# Patient Record
Sex: Male | Born: 1970 | Race: Black or African American | Hispanic: No | Marital: Married | Smoking: Never smoker
Health system: Southern US, Community
[De-identification: ages and names within clinical notes are randomized; demographics above are authoritative.]

---

## 2003-11-06 ENCOUNTER — Ambulatory Visit (HOSPITAL_COMMUNITY): Admission: RE | Admit: 2003-11-06 | Discharge: 2003-11-06 | Payer: Self-pay | Admitting: Family Medicine

## 2003-12-30 ENCOUNTER — Emergency Department (HOSPITAL_COMMUNITY): Admission: EM | Admit: 2003-12-30 | Discharge: 2003-12-30 | Payer: Self-pay | Admitting: Emergency Medicine

## 2007-01-10 ENCOUNTER — Ambulatory Visit (HOSPITAL_COMMUNITY): Admission: RE | Admit: 2007-01-10 | Discharge: 2007-01-10 | Payer: Self-pay | Admitting: Family Medicine

## 2007-07-11 ENCOUNTER — Ambulatory Visit (HOSPITAL_COMMUNITY): Admission: RE | Admit: 2007-07-11 | Discharge: 2007-07-11 | Payer: Self-pay | Admitting: Internal Medicine

## 2010-03-03 ENCOUNTER — Ambulatory Visit (HOSPITAL_COMMUNITY): Admission: RE | Admit: 2010-03-03 | Discharge: 2010-03-03 | Payer: Self-pay | Admitting: Family Medicine

## 2015-07-10 ENCOUNTER — Emergency Department (HOSPITAL_COMMUNITY)
Admission: EM | Admit: 2015-07-10 | Discharge: 2015-07-10 | Disposition: A | Discharge status: Home / Self Care | Payer: BLUE CROSS/BLUE SHIELD | Attending: Emergency Medicine | Admitting: Emergency Medicine

## 2015-07-10 ENCOUNTER — Emergency Department (HOSPITAL_COMMUNITY): Payer: BLUE CROSS/BLUE SHIELD

## 2015-07-10 ENCOUNTER — Encounter (HOSPITAL_COMMUNITY): Payer: Self-pay | Admitting: *Deleted

## 2015-07-10 DIAGNOSIS — R0789 Other chest pain: Secondary | ICD-10-CM | POA: Diagnosis not present

## 2015-07-10 DIAGNOSIS — R079 Chest pain, unspecified: Secondary | ICD-10-CM | POA: Diagnosis present

## 2015-07-10 DIAGNOSIS — Z79899 Other long term (current) drug therapy: Secondary | ICD-10-CM | POA: Diagnosis not present

## 2015-07-10 LAB — BASIC METABOLIC PANEL
Anion gap: 9 (ref 5–15)
BUN: 17 mg/dL (ref 6–20)
CO2: 27 mmol/L (ref 22–32)
Calcium: 9.3 mg/dL (ref 8.9–10.3)
Chloride: 105 mmol/L (ref 101–111)
Creatinine, Ser: 1.2 mg/dL (ref 0.61–1.24)
GFR calc Af Amer: >60 mL/min (ref >60)
GFR calc non Af Amer: >60 mL/min (ref >60)
Glucose, Bld: 96 mg/dL (ref 65–99)
Potassium: 3.9 mmol/L (ref 3.5–5.1)
Sodium: 141 mmol/L (ref 135–145)

## 2015-07-10 LAB — CBC
HCT: 43.1 % (ref 39.0–52.0)
Hemoglobin: 14.9 g/dL (ref 13.0–17.0)
MCH: 31.8 pg (ref 26.0–34.0)
MCHC: 34.6 g/dL (ref 30.0–36.0)
MCV: 91.9 fL (ref 78.0–100.0)
Platelets: 193 10*3/uL (ref 150–400)
RBC: 4.69 MIL/uL (ref 4.22–5.81)
RDW: 14.2 % (ref 11.5–15.5)
WBC: 9 10*3/uL (ref 4.0–10.5)

## 2015-07-10 LAB — D-DIMER, QUANTITATIVE: D-Dimer, Quant: 0.29 ug/mL-FEU (ref 0.00–0.50)

## 2015-07-10 LAB — TROPONIN I
Troponin I: <0.03 ng/mL (ref <0.031)
Troponin I: <0.03 ng/mL (ref <0.031)

## 2015-07-10 MED ORDER — KETOROLAC TROMETHAMINE 30 MG/ML IJ SOLN
30.0000 mg | Freq: Once | INTRAMUSCULAR | Status: AC
  Administered 2015-07-10: 30 mg via INTRAVENOUS
  Filled 2015-07-10: qty 1

## 2015-07-10 MED ORDER — TRAMADOL HCL 50 MG PO TABS: 50.0000 mg | ORAL_TABLET | Freq: Four times a day (QID) | ORAL | Status: DC | PRN

## 2015-07-10 NOTE — ED Provider Notes (Signed)
CSN: [id number]     Arrival date & time 07/10/15  1923 History   First MD Initiated Contact with Patient 07/10/15 1931     Chief Complaint  Patient presents with  . Chest Pain     (Consider location/radiation/quality/duration/timing/severity/associated sxs/prior Treatment) Patient is a 44 y.o. male presenting with chest pain. The history is provided by the patient.  Chest Pain Pain location:  L chest Pain quality: sharp   Pain radiates to:  Does not radiate Pain radiates to the back: no   Pain severity:  Moderate Onset quality:  Sudden Timing:  Intermittent Progression:  Unchanged Chronicity:  New Context comment:  Worsened with deep inspiration and with certain postions, stretching his left arm over head. Relieved by:  Rest Worsened by:  Deep breathing, movement and certain positions Ineffective treatments: has tried tylenol and ibuprofen without relief. Associated symptoms: no abdominal pain, no back pain, no cough, no diaphoresis, no fever, no nausea, no numbness, no palpitations, no shortness of breath and not vomiting   Risk factors: male sex   Risk factors: no aortic disease, no coronary artery disease, no diabetes mellitus, no hypertension, no prior DVT/PE and no smoking   Risk factors comment:  Denies family history of cad before age of 71.    History reviewed. No pertinent past medical history. History reviewed. No pertinent past surgical history. No family history on file. Social History  Substance Use Topics  . Smoking status: Never Smoker   . Smokeless tobacco: None  . Alcohol Use: No    Review of Systems  Constitutional: Negative for fever and diaphoresis.  Respiratory: Negative for cough and shortness of breath.   Cardiovascular: Positive for chest pain. Negative for palpitations.  Gastrointestinal: Negative for nausea, vomiting and abdominal pain.  Musculoskeletal: Negative for back pain.  Neurological: Negative for numbness.      Allergies  Review  of patient's allergies indicates no known allergies.  Home Medications   Prior to Admission medications   Medication Sig Start Date End Date Taking? Authorizing Provider  loratadine (CLARITIN) 10 MG tablet Take 10 mg by mouth daily.   Yes Historical Provider, MD  methocarbamol (ROBAXIN) 500 MG tablet Take 500 mg by mouth 3 (three) times daily. For 10 days 06/26/15  Yes Historical Provider, MD  traMADol (ULTRAM) 50 MG tablet Take 1 tablet (50 mg total) by mouth every 6 (six) hours as needed. 07/10/15   Burgess Amor, PA-C   BP 104/86 mmHg  Pulse 51  Temp(Src) 98.3 F (36.8 C) (Oral)  Resp 18  Ht  (1.676 m)  Wt 86.183 kg  BMI 30.68 kg/m2  SpO2 95% Physical Exam  Constitutional: He appears well-developed and well-nourished.  HENT:  Head: Normocephalic and atraumatic.  Eyes: Conjunctivae are normal.  Neck: Normal range of motion.  Cardiovascular: Normal rate, regular rhythm, normal heart sounds and intact distal pulses.   Pulmonary/Chest: Effort normal and breath sounds normal. He has no wheezes. He exhibits tenderness.    Abdominal: Soft. Bowel sounds are normal. There is no tenderness.  Musculoskeletal: Normal range of motion.  Neurological: He is alert.  Skin: Skin is warm and dry.  Psychiatric: He has a normal mood and affect.  Nursing note and vitals reviewed.   ED Course  Procedures (including critical care time) Labs Review Labs Reviewed  BASIC METABOLIC PANEL  CBC  TROPONIN I  D-DIMER, QUANTITATIVE (NOT AT [facility])  TROPONIN I    Imaging Review Dg Chest 2 View  07/10/2015  CLINICAL  DATA:  Chest pain for 2 weeks, worse today. EXAM: CHEST  2 VIEW COMPARISON:  07/11/2007 FINDINGS: The heart size and mediastinal contours are within normal limits. Both lungs are clear. The visualized skeletal structures are unremarkable. IMPRESSION: No active cardiopulmonary disease. Electronically Signed   By: Burman NievesWilliam  Stevens M.D.   On: 07/10/2015 20:15   I have personally  reviewed and evaluated these images and lab results as part of my medical decision-making.   EKG Interpretation   Date/Time:  Thursday July 10 2015 19:31:51 EST Ventricular Rate:  57 PR Interval:  124 QRS Duration: 90 QT Interval:  400 QTC Calculation: 389 R Axis:   103 Text Interpretation:  Sinus rhythm Right axis deviation No previous ECGs  available Confirmed by Manus GunningANCOUR  MD, STEPHEN [phone]([id number]) on 07/10/2015 7:36:18  PM      MDM   Final diagnoses:  Left-sided chest wall pain    Patients labs reviewed.  Radiological studies were viewed, interpreted and considered during the medical decision making and disposition process. I agree with radiologists reading.  Results were also discussed with patient. Delta troponins negative, ekg without concerning pattern for cad.  Reproducible chest wall pain.  He was given tramadol, also advised to continue ibuprofen for the next week, heat tx to chest wall, f/u with pcp in 1 week if not improved. Doubt cad, d dimer negative in a perc neg pt, hx not c/w PE.       Burgess AmorJulie Rithik Odea, PA-C 07/11/15 1216  Glynn OctaveStephen Rancour, MD 07/12/15 [id number]

## 2015-07-10 NOTE — ED Notes (Signed)
Pt states chest pains constant for the past 2 weeks, worse tonight. Pain increases when he takes a deep breath.

## 2015-07-10 NOTE — Discharge Instructions (Signed)
Chest Wall Pain Chest wall pain is pain in or around the bones and muscles of your chest. Sometimes, an injury causes this pain. Sometimes, the cause may not be known. This pain may take several weeks or longer to get better. HOME CARE INSTRUCTIONS  Pay attention to any changes in your symptoms. Take these actions to help with your pain:   Rest as told by your health care provider.   Avoid activities that cause pain. These include any activities that use your chest muscles or your abdominal and side muscles to lift heavy items.   If directed, apply ice to the painful area:  Apply a heating pad to your chest wall 20 minutes several times daily  Take over-the-counter and prescription medicines only as told by your health care provider.  Do not use tobacco products, including cigarettes, chewing tobacco, and e-cigarettes. If you need help quitting, ask your health care provider.  Keep all follow-up visits as told by your health care provider. This is important. SEEK MEDICAL CARE IF:  You have a fever.  Your chest pain becomes worse.  You have new symptoms. SEEK IMMEDIATE MEDICAL CARE IF:  You have nausea or vomiting.  You feel sweaty or light-headed.  You have a cough with phlegm (sputum) or you cough up blood.  You develop shortness of breath.   This information is not intended to replace advice given to you by your health care provider. Make sure you discuss any questions you have with your health care provider.   Document Released: 08/02/2005 Document Revised: 04/23/2015 Document Reviewed: 10/28/2014 Elsevier Interactive Patient Education 2016 [employer]   You may take the tramadol prescribed for pain relief.  This can make you drowsy - do not drive within 4 hours of taking this medication.

## 2015-12-20 ENCOUNTER — Ambulatory Visit (INDEPENDENT_AMBULATORY_CARE_PROVIDER_SITE_OTHER): Payer: BLUE CROSS/BLUE SHIELD | Admitting: Family Medicine

## 2015-12-20 VITALS — BP 108/68 | HR 82 | Temp 97.8°F | Resp 16 | Ht 68.0 in | Wt 205.8 lb

## 2015-12-20 DIAGNOSIS — M545 Low back pain, unspecified: Secondary | ICD-10-CM | POA: Insufficient documentation

## 2015-12-20 LAB — POCT URINALYSIS DIP (MANUAL ENTRY)
Bilirubin, UA: NEGATIVE
Blood, UA: NEGATIVE
Glucose, UA: NEGATIVE
Ketones, POC UA: NEGATIVE
Leukocytes, UA: NEGATIVE
Nitrite, UA: NEGATIVE
Protein Ur, POC: NEGATIVE
Spec Grav, UA: 1.015
Urobilinogen, UA: 0.2
pH, UA: 5.5

## 2015-12-20 MED ORDER — METHOCARBAMOL 500 MG PO TABS: 500.0000 mg | ORAL_TABLET | Freq: Three times a day (TID) | ORAL | Status: DC

## 2015-12-20 MED ORDER — TRAMADOL HCL 50 MG PO TABS: 50.0000 mg | ORAL_TABLET | Freq: Four times a day (QID) | ORAL | Status: DC | PRN

## 2015-12-20 MED ORDER — METHYLPREDNISOLONE ACETATE 80 MG/ML IJ SUSP
80.0000 mg | Freq: Once | INTRAMUSCULAR | Status: AC
  Administered 2015-12-20: 80 mg via INTRAMUSCULAR

## 2015-12-20 NOTE — Progress Notes (Deleted)
    MRN: [id number] DOB: 09/27/1970  Subjective:   Tanner Black is a 45 y.o. male presenting for chief complaint of Back Pain    Tanner Black has a current medication list which includes the following prescription(s): loratadine, methocarbamol, and tramadol. Also has No Known Allergies.  Tanner Black  has no past medical history on file. Also  has no past surgical history on file.  Objective:   Vitals: BP 108/68 mmHg  Pulse 82  Temp(Src) 97.8 F (36.6 C) (Oral)  Resp 16  Ht 5\' 8"  (1.727 m)  Wt 205 lb 12.8 oz (93.35 kg)  BMI 31.30 kg/m2  SpO2 97%  Physical Exam  No results found for this or any previous visit (from the past 24 hour(s)).  Assessment and Plan :     Tanner BambergMario Kasem Mozer, PA-C [facility] [phone] 12/20/2015 2:01 PM

## 2015-12-20 NOTE — Patient Instructions (Signed)
1. No sodas or sweet tea, you can have sparkling water 2. Don't eat after 7 pm 3. Plan tomorrow's meal today  Do the plank exercise daily

## 2015-12-20 NOTE — Progress Notes (Signed)
This is a 45 y.o.male who complains of low back pain x 2 days  Character of pain: constant ache Location of pain:  Belt line in lower lumbar area Radiation of pain:  none Onset associated with:  nothing Patient has a past history of low back pain for which   Melburn L Cumbo denies any urinary symptoms, bowel problems, numbness in the legs, loss of motor power. Pharoah L Drewes had no fever.  STarrance L Schulte has tried ibuprofen  No past medical history on file.   No past surgical history on file.  Objective:  middle-aged male in no acute distress. Blood pressure 108/68, pulse 82, temperature 97.8 F (36.6 C), temperature source Oral, resp. rate 16, height 5\' 8"  (1.727 m), weight 205 lb 12.8 oz (93.35 kg), SpO2 97 %.Body mass index is 31.3 kg/(m^2). Palpation of the back reveals mildly tender CVA:  nontender Abdomen: soft and nontender Peripheral pulses:  DP/PT ok Inspection of the back: Reveals no scoliosis Straight-leg raising: positive each side about  80 degrees Motor exam of lower extremity: No abnormal weakness. Reflexes: Symmetric and normal Skin exam: no rash  Assessment/Plan: Acute lower back pain without acute neurological findings.    ICD-9-CM ICD-10-CM   1. Right-sided low back pain without sciatica 724.2 M54.5 traMADol (ULTRAM) 50 MG tablet     methocarbamol (ROBAXIN) 500 MG tablet     POCT urinalysis dipstick     methylPREDNISolone acetate (DEPO-MEDROL) injection 80 mg   Elvina SidleKurt Amiley Shishido, MD

## 2016-02-10 DIAGNOSIS — S39012A Strain of muscle, fascia and tendon of lower back, initial encounter: Secondary | ICD-10-CM | POA: Diagnosis not present

## 2016-02-10 DIAGNOSIS — M5416 Radiculopathy, lumbar region: Secondary | ICD-10-CM | POA: Diagnosis not present

## 2016-03-02 DIAGNOSIS — S39012S Strain of muscle, fascia and tendon of lower back, sequela: Secondary | ICD-10-CM | POA: Diagnosis not present

## 2016-03-02 DIAGNOSIS — M5416 Radiculopathy, lumbar region: Secondary | ICD-10-CM | POA: Diagnosis not present

## 2016-04-11 DIAGNOSIS — J069 Acute upper respiratory infection, unspecified: Secondary | ICD-10-CM | POA: Diagnosis not present

## 2016-09-03 DIAGNOSIS — E663 Overweight: Secondary | ICD-10-CM | POA: Diagnosis not present

## 2016-09-03 DIAGNOSIS — E669 Obesity, unspecified: Secondary | ICD-10-CM | POA: Diagnosis not present

## 2016-09-03 DIAGNOSIS — Z6829 Body mass index (BMI) 29.0-29.9, adult: Secondary | ICD-10-CM | POA: Diagnosis not present

## 2016-09-03 DIAGNOSIS — M545 Low back pain: Secondary | ICD-10-CM | POA: Diagnosis not present

## 2016-09-03 DIAGNOSIS — Z1389 Encounter for screening for other disorder: Secondary | ICD-10-CM | POA: Diagnosis not present

## 2016-09-20 DIAGNOSIS — Z1389 Encounter for screening for other disorder: Secondary | ICD-10-CM | POA: Diagnosis not present

## 2016-09-20 DIAGNOSIS — Z6829 Body mass index (BMI) 29.0-29.9, adult: Secondary | ICD-10-CM | POA: Diagnosis not present

## 2016-09-20 DIAGNOSIS — Z23 Encounter for immunization: Secondary | ICD-10-CM | POA: Diagnosis not present

## 2016-09-20 DIAGNOSIS — E663 Overweight: Secondary | ICD-10-CM | POA: Diagnosis not present

## 2016-09-20 DIAGNOSIS — M545 Low back pain: Secondary | ICD-10-CM | POA: Diagnosis not present

## 2016-10-07 DIAGNOSIS — M545 Low back pain: Secondary | ICD-10-CM | POA: Diagnosis not present

## 2016-10-17 DIAGNOSIS — M545 Low back pain: Secondary | ICD-10-CM | POA: Diagnosis not present

## 2016-10-31 DIAGNOSIS — R079 Chest pain, unspecified: Secondary | ICD-10-CM | POA: Diagnosis not present

## 2017-06-20 DIAGNOSIS — Z6828 Body mass index (BMI) 28.0-28.9, adult: Secondary | ICD-10-CM | POA: Diagnosis not present

## 2017-06-20 DIAGNOSIS — Z1389 Encounter for screening for other disorder: Secondary | ICD-10-CM | POA: Diagnosis not present

## 2017-06-20 DIAGNOSIS — R200 Anesthesia of skin: Secondary | ICD-10-CM | POA: Diagnosis not present

## 2017-06-20 DIAGNOSIS — E663 Overweight: Secondary | ICD-10-CM | POA: Diagnosis not present

## 2017-07-19 DIAGNOSIS — Z6828 Body mass index (BMI) 28.0-28.9, adult: Secondary | ICD-10-CM | POA: Diagnosis not present

## 2017-07-19 DIAGNOSIS — Z0000 Encounter for general adult medical examination without abnormal findings: Secondary | ICD-10-CM | POA: Diagnosis not present

## 2017-07-19 DIAGNOSIS — Z1389 Encounter for screening for other disorder: Secondary | ICD-10-CM | POA: Diagnosis not present

## 2017-07-19 DIAGNOSIS — E663 Overweight: Secondary | ICD-10-CM | POA: Diagnosis not present

## 2017-10-09 DIAGNOSIS — G43009 Migraine without aura, not intractable, without status migrainosus: Secondary | ICD-10-CM | POA: Diagnosis not present

## 2017-12-13 DIAGNOSIS — J019 Acute sinusitis, unspecified: Secondary | ICD-10-CM | POA: Diagnosis not present

## 2017-12-13 DIAGNOSIS — J301 Allergic rhinitis due to pollen: Secondary | ICD-10-CM | POA: Diagnosis not present

## 2017-12-13 DIAGNOSIS — Z1389 Encounter for screening for other disorder: Secondary | ICD-10-CM | POA: Diagnosis not present

## 2017-12-13 DIAGNOSIS — Z6829 Body mass index (BMI) 29.0-29.9, adult: Secondary | ICD-10-CM | POA: Diagnosis not present

## 2018-09-12 DIAGNOSIS — J019 Acute sinusitis, unspecified: Secondary | ICD-10-CM | POA: Diagnosis not present

## 2018-09-12 DIAGNOSIS — Z0000 Encounter for general adult medical examination without abnormal findings: Secondary | ICD-10-CM | POA: Diagnosis not present

## 2018-09-12 DIAGNOSIS — Z6831 Body mass index (BMI) 31.0-31.9, adult: Secondary | ICD-10-CM | POA: Diagnosis not present

## 2018-09-12 DIAGNOSIS — Z1389 Encounter for screening for other disorder: Secondary | ICD-10-CM | POA: Diagnosis not present

## 2018-09-12 DIAGNOSIS — Z0001 Encounter for general adult medical examination with abnormal findings: Secondary | ICD-10-CM | POA: Diagnosis not present

## 2018-09-12 DIAGNOSIS — E6609 Other obesity due to excess calories: Secondary | ICD-10-CM | POA: Diagnosis not present

## 2019-04-10 DIAGNOSIS — M47816 Spondylosis without myelopathy or radiculopathy, lumbar region: Secondary | ICD-10-CM | POA: Diagnosis not present

## 2019-04-10 DIAGNOSIS — M545 Low back pain: Secondary | ICD-10-CM | POA: Diagnosis not present

## 2019-04-10 DIAGNOSIS — Z6830 Body mass index (BMI) 30.0-30.9, adult: Secondary | ICD-10-CM | POA: Diagnosis not present

## 2019-04-10 DIAGNOSIS — R252 Cramp and spasm: Secondary | ICD-10-CM | POA: Diagnosis not present

## 2019-10-02 DIAGNOSIS — Z681 Body mass index (BMI) 19 or less, adult: Secondary | ICD-10-CM | POA: Diagnosis not present

## 2019-10-02 DIAGNOSIS — J019 Acute sinusitis, unspecified: Secondary | ICD-10-CM | POA: Diagnosis not present

## 2020-08-03 ENCOUNTER — Encounter: Payer: Self-pay | Admitting: Emergency Medicine

## 2020-08-03 ENCOUNTER — Ambulatory Visit
Admission: EM | Admit: 2020-08-03 | Discharge: 2020-08-03 | Disposition: A | Discharge status: Home / Self Care | Payer: BLUE CROSS/BLUE SHIELD | Attending: Emergency Medicine | Admitting: Emergency Medicine

## 2020-08-03 ENCOUNTER — Other Ambulatory Visit: Payer: Self-pay

## 2020-08-03 DIAGNOSIS — J309 Allergic rhinitis, unspecified: Secondary | ICD-10-CM | POA: Diagnosis not present

## 2020-08-03 MED ORDER — PREDNISONE 10 MG PO TABS: 20.0000 mg | ORAL_TABLET | Freq: Every day | ORAL | 0 refills | Status: DC

## 2020-08-03 MED ORDER — DEXAMETHASONE SODIUM PHOSPHATE 10 MG/ML IJ SOLN
10.0000 mg | Freq: Once | INTRAMUSCULAR | Status: AC
  Administered 2020-08-03: 10 mg via INTRAMUSCULAR

## 2020-08-03 NOTE — Discharge Instructions (Addendum)
Decadron 10 mg shot given in office Continue Benadryl as prescribed and directed Prednisone was prescribed/take as directed Rest push fluids Return or follow up with PCP  Return sooner or go to the ED if you have any new or worsening symptoms such as difficulty breathing, shortness of breath, chest pain, nausea, vomiting, throat tightness or swelling, tongue swelling or tingling, worsening lip or facial swelling, abdominal pain, changes in bowel or bladder habits, no improvement despite medications, etc..Marland Kitchen

## 2020-08-03 NOTE — ED Triage Notes (Signed)
Sinus pressure and headache x 4 days.  Has tried otc allergy / decongestant with minimal relief.

## 2020-08-03 NOTE — ED Provider Notes (Signed)
[facility]   [id number] 08/03/20 Arrival Time: 6734  Cc: Allergic reaction  SUBJECTIVE:  RANFERI CLINGAN is a 49 y.o. male with history of seasonal allergy presented to the urgent care for complaint of congestion, sinus pressure and headache for the past 4 days.  Denies precipitating event, known exposure to allergy or trigger. Denies changes in medication or starting a new medication.  Has tried OTC Benadryl with mild relief.  Denies aggravating factors.  Denies previous symptoms in the past.   Denies fever, chills, nausea, vomiting, erythema, redness, swollen glands, oral manifestations such as throat swelling/ tingling, mouth swelling/ tingling, tongue swelling/tingling, dyspnea, SOB, chest pain, abdominal pain, changes in bowel or bladder function.     ROS: As per HPI.  All other pertinent ROS negative.     History reviewed. No pertinent past medical history. History reviewed. No pertinent surgical history. No Known Allergies No current facility-administered medications on file prior to encounter.   Current Outpatient Medications on File Prior to Encounter  Medication Sig Dispense Refill  . loratadine (CLARITIN) 10 MG tablet Take 10 mg by mouth daily.    . methocarbamol (ROBAXIN) 500 MG tablet Take 1 tablet (500 mg total) by mouth 3 (three) times daily. For 10 days 20 tablet 1  . traMADol (ULTRAM) 50 MG tablet Take 1 tablet (50 mg total) by mouth every 6 (six) hours as needed. 20 tablet 0    Social History   Socioeconomic History  . Marital status: Married    Spouse name: Not on file  . Number of children: Not on file  . Years of education: Not on file  . Highest education level: Not on file  Occupational History  . Not on file  Tobacco Use  . Smoking status: Never Smoker  . Smokeless tobacco: Never Used  Substance and Sexual Activity  . Alcohol use: Yes    Comment: occ  . Drug use: No  . Sexual activity: Not on file  Other Topics Concern  . Not on file   Social History Narrative  . Not on file   Social Determinants of Health   Financial Resource Strain: Not on file  Food Insecurity: Not on file  Transportation Needs: Not on file  Physical Activity: Not on file  Stress: Not on file  Social Connections: Not on file  Intimate Partner Violence: Not on file   No family history on file.   OBJECTIVE:  Vitals:   08/03/20 0946 08/03/20 0947  BP:  106/70  Pulse:  62  Resp:  18  Temp:  98.6 F (37 C)  TempSrc:  Oral  SpO2:  96%  Weight: 210 lb (95.3 kg)   Height: 5\' 8"  (1.727 m)      General appearance: Alert, speaking in full sentences without difficulty HEENT:NCAT; no obvious facial swelling; Ears: EACs clear, TMs pearly gray; Eyes: PERRL.  EOM grossly intact. Nose: nares patent without rhinorrhea; Throat: tonsils nonerythematous or enlarged, uvula midline Neck: supple without LAD Lungs: clear to auscultation bilaterally without adventitious breath sounds; normal respiratory effort; no labored respirations Heart: regular rate and rhythm.  Radial pulses 2+ symmetrical bilaterally; cap refill < 2 seconds Abdomen: soft, nondistended, normal active bowel sounds; nontender to palpation; no guarding  Skin: warm and dry Psychological: alert and cooperative; normal mood and affect  ASSESSMENT & PLAN:  1. Allergic sinusitis     Meds ordered this encounter  Medications  . dexamethasone (DECADRON) injection 10 mg  . predniSONE (DELTASONE) 10 MG  tablet    Sig: Take 2 tablets (20 mg total) by mouth daily.    Dispense:  15 tablet    Refill:  0    No orders of the defined types were placed in this encounter.   Discharge instructions  Decadron 10 mg shot given in office Continue Benadryl as prescribed and directed Prednisone was prescribed/take as directed Rest push fluids Return or follow up with PCP  Return sooner or go to the ED if you have any new or worsening symptoms such as difficulty breathing, shortness of breath,  chest pain, nausea, vomiting, throat tightness or swelling, tongue swelling or tingling, worsening lip or facial swelling, abdominal pain, changes in bowel or bladder habits, no improvement despite medications, etc...   Reviewed expectations re: course of current medical issues. Questions answered. Outlined signs and symptoms indicating need for more acute intervention. Patient verbalized understanding. After Visit Summary given.          Durward Parcel, FNP 08/03/20 1014

## 2020-08-07 DIAGNOSIS — R070 Pain in throat: Secondary | ICD-10-CM | POA: Diagnosis not present

## 2020-08-07 DIAGNOSIS — R238 Other skin changes: Secondary | ICD-10-CM | POA: Diagnosis not present

## 2020-08-07 DIAGNOSIS — Z0001 Encounter for general adult medical examination with abnormal findings: Secondary | ICD-10-CM | POA: Diagnosis not present

## 2020-08-07 DIAGNOSIS — Z0000 Encounter for general adult medical examination without abnormal findings: Secondary | ICD-10-CM | POA: Diagnosis not present

## 2020-08-07 DIAGNOSIS — Z6831 Body mass index (BMI) 31.0-31.9, adult: Secondary | ICD-10-CM | POA: Diagnosis not present

## 2020-08-07 DIAGNOSIS — E6609 Other obesity due to excess calories: Secondary | ICD-10-CM | POA: Diagnosis not present

## 2020-12-14 ENCOUNTER — Encounter: Payer: Self-pay | Admitting: *Deleted

## 2020-12-14 ENCOUNTER — Ambulatory Visit
Admission: EM | Admit: 2020-12-14 | Discharge: 2020-12-14 | Disposition: A | Discharge status: Home / Self Care | Payer: BC Managed Care – PPO

## 2020-12-14 ENCOUNTER — Other Ambulatory Visit: Payer: Self-pay

## 2020-12-14 DIAGNOSIS — J069 Acute upper respiratory infection, unspecified: Secondary | ICD-10-CM

## 2020-12-14 MED ORDER — PREDNISONE 20 MG PO TABS: 40.0000 mg | ORAL_TABLET | Freq: Every day | ORAL | 0 refills | Status: AC

## 2020-12-14 MED ORDER — PROMETHAZINE-DM 6.25-15 MG/5ML PO SYRP: 5.0000 mL | ORAL_SOLUTION | Freq: Four times a day (QID) | ORAL | 0 refills | Status: DC | PRN

## 2020-12-14 NOTE — ED Triage Notes (Signed)
C/O sinus pressure and congestion, cough, body aches, chills.  Has tried antihistamine with decongestant without relief.  Denies fevers.

## 2020-12-14 NOTE — ED Provider Notes (Signed)
[facility]    CSN: [id number] Arrival date & time: 12/14/20  1226      History   Chief Complaint Chief Complaint  Patient presents with  . Facial Pain    HPI Tanner Black is a 50 y.o. male.   HPI  Patient presents with 3 days of facial pain, nasal congestion, sinus pressure, cough with body aches. Sinus pressure is mainly in maxillary region. He has taken otc medication without relief of symptoms with antihistamines and decongestants. History reviewed. No pertinent past medical history.  Patient Active Problem List   Diagnosis Date Noted  . Recurrent low back pain 12/20/2015    History reviewed. No pertinent surgical history.     Home Medications    Prior to Admission medications   Medication Sig Start Date End Date Taking? Authorizing Provider  Loratadine-Pseudoephedrine (WAL-ITIN D PO) Take by mouth.   Yes [provider]  loratadine (CLARITIN) 10 MG tablet Take 10 mg by mouth daily.    [provider]  methocarbamol (ROBAXIN) 500 MG tablet Take 1 tablet (500 mg total) by mouth 3 (three) times daily. For 10 days 12/20/15   Elvina Sidle, MD  predniSONE (DELTASONE) 10 MG tablet Take 2 tablets (20 mg total) by mouth daily. 08/03/20   Avegno, Zachery Dakins, FNP  traMADol (ULTRAM) 50 MG tablet Take 1 tablet (50 mg total) by mouth every 6 (six) hours as needed. 12/20/15   Elvina Sidle, MD    Family History Family History  Problem Relation Age of Onset  . Healthy Mother     Social History Social History   Tobacco Use  . Smoking status: Never Smoker  . Smokeless tobacco: Never Used  Vaping Use  . Vaping Use: Never used  Substance Use Topics  . Alcohol use: Not Currently  . Drug use: No     Allergies   Patient has no known allergies.   Review of Systems Review of Systems Pertinent negatives listed in HPI  Physical Exam Triage Vital Signs ED Triage Vitals  Enc Vitals Group     BP 12/14/20 1514 112/72     Pulse Rate  12/14/20 1514 79     Resp 12/14/20 1514 20     Temp 12/14/20 1514 97.9 F (36.6 C)     Temp Source 12/14/20 1514 Temporal     SpO2 12/14/20 1514 94 %     Weight --      Height --      Head Circumference --      Peak Flow --      Pain Score 12/14/20 1516 7     Pain Loc --      Pain Edu? --      Excl. in GC? --    No data found.  Updated Vital Signs BP 112/72   Pulse 79   Temp 97.9 F (36.6 C) (Temporal)   Resp 20   SpO2 94%   Visual Acuity Right Eye Distance:   Left Eye Distance:   Bilateral Distance:    Right Eye Near:   Left Eye Near:    Bilateral Near:     Physical Exam  General Appearance:    Alert, cooperative, no distress  HENT:   Normocephalic, ears normal, congestion, rhinorrhea, oropharynx clear     Eyes:    PERRL, conjunctiva/corneas clear, EOM's intact       Lungs:     Clear to auscultation bilaterally, respirations unlabored  Heart:    Regular rate  and rhythm  Neurologic:   Awake, alert, oriented x 3. No apparent focal neurological           defect.     UC Treatments / Results  Labs (all labs ordered are listed, but only abnormal results are displayed) Labs Reviewed - No data to display  EKG   Radiology No results found.  Procedures Procedures (including critical care time)  Medications Ordered in UC Medications - No data to display  Initial Impression / Assessment and Plan / UC Course  I have reviewed the triage vital signs and the nursing notes.  Pertinent labs & imaging results that were available during my care of the patient were reviewed by me and considered in my medical decision making (see chart for details).     URI , viral, treatment per discharge medications. Continue antihistamine therapy. Follow-up with PCP as needed or return if symptoms do not improve. Final Clinical Impressions(s) / UC Diagnoses   Final diagnoses:  Upper respiratory tract infection, unspecified type   Discharge Instructions   None    ED  Prescriptions    Medication Sig Dispense Auth. Provider   promethazine-dextromethorphan (PROMETHAZINE-DM) 6.25-15 MG/5ML syrup Take 5 mLs by mouth 4 (four) times daily as needed for cough. 140 mL Bing Neighbors, FNP   predniSONE (DELTASONE) 20 MG tablet Take 2 tablets (40 mg total) by mouth daily with breakfast for 5 days. 10 tablet Bing Neighbors, FNP     PDMP not reviewed this encounter.   Bing Neighbors, FNP 12/20/20 1704

## 2021-08-04 ENCOUNTER — Ambulatory Visit
Admission: EM | Admit: 2021-08-04 | Discharge: 2021-08-04 | Disposition: A | Discharge status: Home / Self Care | Payer: BC Managed Care – PPO | Attending: Student | Admitting: Student

## 2021-08-04 ENCOUNTER — Other Ambulatory Visit: Payer: Self-pay

## 2021-08-04 DIAGNOSIS — J0100 Acute maxillary sinusitis, unspecified: Secondary | ICD-10-CM

## 2021-08-04 MED ORDER — AMOXICILLIN 875 MG PO TABS: 875.0000 mg | ORAL_TABLET | Freq: Two times a day (BID) | ORAL | 0 refills | Status: AC

## 2021-08-04 MED ORDER — FLUTICASONE PROPIONATE 50 MCG/ACT NA SUSP: 2.0000 | Freq: Every day | NASAL | 2 refills | Status: AC

## 2021-08-04 NOTE — ED Triage Notes (Signed)
Right ear pain,  throat pain on right side, nasal congestion - yellow in color x 1 week.

## 2021-08-04 NOTE — ED Provider Notes (Signed)
[facility]    CSN: [id number] Arrival date & time: 08/04/21  1744      History   Chief Complaint No chief complaint on file.   HPI Tanner Black is a 50 y.o. male presenting with sinus pressure following 1 week of viral syndrome.  Initially describes cough, congestion; now with progressively worsening facial pressure radiating to the right ear and down the right side of the neck, with some postnasal drip and sore throat.  Denies fever/chills.  Denies current cough, fever/chills, shortness of breath, chest pain, dizziness.  Wife with similar symptoms.  HPI  History reviewed. No pertinent past medical history.  Patient Active Problem List   Diagnosis Date Noted   Recurrent low back pain 12/20/2015    History reviewed. No pertinent surgical history.     Home Medications    Prior to Admission medications   Medication Sig Start Date End Date Taking? Authorizing Provider  amoxicillin (AMOXIL) 875 MG tablet Take 1 tablet (875 mg total) by mouth 2 (two) times daily for 7 days. 08/04/21 08/11/21 Yes Hazel Sams, PA-C  fluticasone (FLONASE) 50 MCG/ACT nasal spray Place 2 sprays into both nostrils daily. 08/04/21  Yes Hazel Sams, PA-C  Loratadine-Pseudoephedrine (WAL-ITIN D PO) Take by mouth.    [provider]    Family History Family History  Problem Relation Age of Onset   Healthy Mother     Social History Social History   Tobacco Use   Smoking status: Never   Smokeless tobacco: Never  Vaping Use   Vaping Use: Never used  Substance Use Topics   Alcohol use: Not Currently   Drug use: No     Allergies   Patient has no known allergies.   Review of Systems Review of Systems  Constitutional:  Negative for appetite change, chills and fever.  HENT:  Positive for congestion, ear pain, postnasal drip, sinus pressure and sore throat. Negative for rhinorrhea and sinus pain.   Eyes:  Negative for redness and visual disturbance.   Respiratory:  Negative for cough, chest tightness, shortness of breath and wheezing.   Cardiovascular:  Negative for chest pain and palpitations.  Gastrointestinal:  Negative for abdominal pain, constipation, diarrhea, nausea and vomiting.  Genitourinary:  Negative for dysuria, frequency and urgency.  Musculoskeletal:  Negative for myalgias.  Neurological:  Negative for dizziness, weakness and headaches.  Psychiatric/Behavioral:  Negative for confusion.   All other systems reviewed and are negative.   Physical Exam Triage Vital Signs ED Triage Vitals  Enc Vitals Group     BP 08/04/21 1900 116/80     Pulse Rate 08/04/21 1900 84     Resp 08/04/21 1900 18     Temp 08/04/21 1900 99.4 F (37.4 C)     Temp Source 08/04/21 1900 Oral     SpO2 08/04/21 1900 98 %     Weight --      Height --      Head Circumference --      Peak Flow --      Pain Score 08/04/21 1901 8     Pain Loc --      Pain Edu? --      Excl. in [place]? --    No data found.  Updated Vital Signs BP 116/80 (BP Location: Right Arm)    Pulse 84    Temp 99.4 F (37.4 C) (Oral)    Resp 18    SpO2 98%   Visual Acuity Right Eye  Distance:   Left Eye Distance:   Bilateral Distance:    Right Eye Near:   Left Eye Near:    Bilateral Near:     Physical Exam Vitals reviewed.  Constitutional:      General: He is not in acute distress.    Appearance: Normal appearance. He is not ill-appearing.  HENT:     Head: Normocephalic and atraumatic.     Right Ear: Tympanic membrane, ear canal and external ear normal. No tenderness. No middle ear effusion. There is no impacted cerumen. Tympanic membrane is not perforated, erythematous, retracted or bulging.     Left Ear: Tympanic membrane, ear canal and external ear normal. No tenderness.  No middle ear effusion. There is no impacted cerumen. Tympanic membrane is not perforated, erythematous, retracted or bulging.     Nose: No congestion.     Right Sinus: Maxillary sinus tenderness  present.     Left Sinus: Maxillary sinus tenderness present.     Mouth/Throat:     Mouth: Mucous membranes are moist.     Pharynx: Uvula midline. Posterior oropharyngeal erythema present. No oropharyngeal exudate.     Tonsils: No tonsillar exudate.  Eyes:     Extraocular Movements: Extraocular movements intact.     Pupils: Pupils are equal, round, and reactive to light.  Cardiovascular:     Rate and Rhythm: Normal rate and regular rhythm.     Heart sounds: Normal heart sounds.  Pulmonary:     Effort: Pulmonary effort is normal.     Breath sounds: Normal breath sounds. No decreased breath sounds, wheezing, rhonchi or rales.  Abdominal:     Palpations: Abdomen is soft.     Tenderness: There is no abdominal tenderness. There is no guarding or rebound.  Lymphadenopathy:     Cervical: No cervical adenopathy.     Right cervical: No superficial cervical adenopathy.    Left cervical: No superficial cervical adenopathy.  Neurological:     General: No focal deficit present.     Mental Status: He is alert and oriented to person, place, and time.  Psychiatric:        Mood and Affect: Mood normal.        Behavior: Behavior normal.        Thought Content: Thought content normal.        Judgment: Judgment normal.     UC Treatments / Results  Labs (all labs ordered are listed, but only abnormal results are displayed) Labs Reviewed - No data to display  EKG   Radiology No results found.  Procedures Procedures (including critical care time)  Medications Ordered in UC Medications - No data to display  Initial Impression / Assessment and Plan / UC Course  I have reviewed the triage vital signs and the nursing notes.  Pertinent labs & imaging results that were available during my care of the patient were reviewed by me and considered in my medical decision making (see chart for details).     This patient is a very pleasant 50 y.o. year old male presenting with sinusitis following  viral URI. Today this pt is afebrile nontachycardic nontachypneic, oxygenating well on room air, no wheezes rhonchi or rales.   Declines Covid and influenza testing, I am in agreement given duration of symptoms.  Amoxicillin and flonase sent as below.   ED return precautions discussed. Patient verbalizes understanding and agreement.     Final Clinical Impressions(s) / UC Diagnoses   Final diagnoses:  Acute non-recurrent maxillary  sinusitis     Discharge Instructions      -Amoxicillin twice daily x7 days, taken with food  -Flonase nasal steroid: place 2 sprays into both nostrils in the morning and at bedtime for at least 7 days. Continue for longer if this is helping. -Follow-up if symptoms worsen/ persist      ED Prescriptions     Medication Sig Dispense Auth. Provider   amoxicillin (AMOXIL) 875 MG tablet Take 1 tablet (875 mg total) by mouth 2 (two) times daily for 7 days. 14 tablet Rhys Martini, PA-C   fluticasone [facility]) 50 MCG/ACT nasal spray Place 2 sprays into both nostrils daily. 15 mL Rhys Martini, PA-C      PDMP not reviewed this encounter.   Rhys Martini, PA-C 08/04/21 [id number]

## 2021-08-04 NOTE — Discharge Instructions (Addendum)
-  Amoxicillin twice daily x7 days, taken with food  -Flonase nasal steroid: place 2 sprays into both nostrils in the morning and at bedtime for at least 7 days. Continue for longer if this is helping. -Follow-up if symptoms worsen/ persist

## 2021-09-09 ENCOUNTER — Other Ambulatory Visit: Payer: Self-pay

## 2021-09-09 ENCOUNTER — Encounter (HOSPITAL_COMMUNITY): Payer: Self-pay

## 2021-09-09 ENCOUNTER — Emergency Department (HOSPITAL_COMMUNITY)
Admission: EM | Admit: 2021-09-09 | Discharge: 2021-09-09 | Disposition: A | Discharge status: Home / Self Care | Payer: Commercial Managed Care - PPO | Attending: Emergency Medicine | Admitting: Emergency Medicine

## 2021-09-09 ENCOUNTER — Emergency Department (HOSPITAL_COMMUNITY): Payer: Commercial Managed Care - PPO

## 2021-09-09 DIAGNOSIS — R0789 Other chest pain: Secondary | ICD-10-CM | POA: Insufficient documentation

## 2021-09-09 DIAGNOSIS — R079 Chest pain, unspecified: Secondary | ICD-10-CM

## 2021-09-09 LAB — BASIC METABOLIC PANEL
Anion gap: 8 (ref 5–15)
BUN: 11 mg/dL (ref 6–20)
CO2: 21 mmol/L — ABNORMAL LOW (ref 22–32)
Calcium: 8.6 mg/dL — ABNORMAL LOW (ref 8.9–10.3)
Chloride: 107 mmol/L (ref 98–111)
Creatinine, Ser: 0.93 mg/dL (ref 0.61–1.24)
GFR, Estimated: >60 mL/min (ref >60)
Glucose, Bld: 105 mg/dL — ABNORMAL HIGH (ref 70–99)
Potassium: 4.2 mmol/L (ref 3.5–5.1)
Sodium: 136 mmol/L (ref 135–145)

## 2021-09-09 LAB — CBC
HCT: 46.2 % (ref 39.0–52.0)
Hemoglobin: 15.8 g/dL (ref 13.0–17.0)
MCH: 31.2 pg (ref 26.0–34.0)
MCHC: 34.2 g/dL (ref 30.0–36.0)
MCV: 91.1 fL (ref 80.0–100.0)
Platelets: 201 10*3/uL (ref 150–400)
RBC: 5.07 MIL/uL (ref 4.22–5.81)
RDW: 14.5 % (ref 11.5–15.5)
WBC: 4.8 10*3/uL (ref 4.0–10.5)
nRBC: 0 % (ref 0.0–0.2)

## 2021-09-09 LAB — TROPONIN I (HIGH SENSITIVITY): Troponin I (High Sensitivity): 3 ng/L (ref <18)

## 2021-09-09 MED ORDER — IBUPROFEN 400 MG PO TABS
400.0000 mg | ORAL_TABLET | Freq: Once | ORAL | Status: AC
  Administered 2021-09-09: 08:00:00 400 mg via ORAL
  Filled 2021-09-09: qty 1

## 2021-09-09 MED ORDER — MELOXICAM 7.5 MG PO TABS: 7.5000 mg | ORAL_TABLET | Freq: Two times a day (BID) | ORAL | 0 refills | Status: AC | PRN

## 2021-09-09 NOTE — Discharge Instructions (Signed)
Your chest x-ray and blood work were unremarkable and reassuring.  I would recommend that you follow-up closely with your family doctor if this is not getting better within 2 or 3 days however rest assured that your testing has not shown any signs of heart attack or lung problems.  I would recommend that you take an anti-inflammatory such as 600 mg of ibuprofen every 6 hours as needed or alternatively I have prescribed a medicine called Mobic.  Do not use that with ibuprofen.  Please return to the emergency department immediately for severe worsening pain, difficulty breathing, nausea, sweating or any other clinical concerns.

## 2021-09-09 NOTE — ED Notes (Signed)
X-ray at bedside

## 2021-09-09 NOTE — ED Triage Notes (Signed)
Pt presents to ED with complaints of left sided chest pain under left breast started yesterday am. Pt states feels like a burning pain. Pt states he does a lot of pulling at work and feels he pulled a muscle. Pt denies SOB, nausea, vomiting associated with pain.

## 2021-09-09 NOTE — ED Provider Notes (Signed)
[facility] EMERGENCY DEPARTMENT Provider Note   CSN: [id number] Arrival date & time: 09/09/21  2774     History  Chief Complaint  Patient presents with   Chest Pain    Tanner Black is a 51 y.o. male.   Chest Pain  Patient is a 51 year old male, has a history of no significant chronic medical conditions, presents to the hospital with a complaint of chest discomfort which is on the left side of his chest and has been present for couple of days.  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  He was pain-free when he went to bed last night, when he woke up this morning he was pain-free but as soon as he moves his left arm he feels a sharp pain under the left pectoral muscle.  At this time when he is resting he does not feel it, additionally he has no associated shortness of breath nausea, diaphoresis or weakness.  He denies any swelling of the legs, denies any exertional symptoms.  States that he does not smoke cigarettes and is not treated for hypertension or hypercholesterolemia or diabetes.  He does not have any family history of heart disease either.  Home Medications Prior to Admission medications   Medication Sig Start Date End Date Taking? Authorizing Provider  fluticasone (FLONASE) 50 MCG/ACT nasal spray Place 2 sprays into both nostrils daily. 08/04/21  Yes Rhys Martini, PA-C  meloxicam (MOBIC) 7.5 MG tablet Take 1 tablet (7.5 mg total) by mouth 2 (two) times daily as needed for up to 14 days for pain. 09/09/21 09/23/21 Yes Eber Hong, MD      Allergies    Patient has no known allergies.    Review of Systems   Review of Systems  Cardiovascular:  Positive for chest pain.  All other systems reviewed and are negative.  Physical Exam Updated Vital Signs BP (!) 121/91    Pulse (!) 58    Temp 98.8 F (37.1 C) (Oral)    Resp 20    Ht 1.727 m (5\' 8" )    Wt  84 kg    SpO2 96%    BMI 28.16 kg/m  Physical Exam Vitals and nursing note reviewed.  Constitutional:      General: He is not in acute distress.    Appearance: He is well-developed.  HENT:     Head: Normocephalic and atraumatic.     Mouth/Throat:     Pharynx: No oropharyngeal exudate.  Eyes:     General: No scleral icterus.       Right eye: No discharge.        Left eye: No discharge.     Conjunctiva/sclera: Conjunctivae normal.     Pupils: Pupils are equal, round, and reactive to light.  Neck:     Thyroid: No thyromegaly.     Vascular: No JVD.  Cardiovascular:     Rate and Rhythm: Normal rate and regular rhythm.     Heart sounds: Normal heart sounds. No murmur heard.   No friction rub. No gallop.  Pulmonary:     Effort: Pulmonary effort is normal. No respiratory distress.     Breath sounds: Normal breath sounds. No wheezing or rales.     Comments: There is reproducible chest discomfort on the left when palpated in the left anterior lower rib margin, there is  no rash overlying the skin of this area Chest:     Chest wall: Tenderness present.  Abdominal:     General: Bowel sounds are normal. There is no distension.     Palpations: Abdomen is soft. There is no mass.     Tenderness: There is no abdominal tenderness.  Musculoskeletal:        General: No tenderness. Normal range of motion.     Cervical back: Normal range of motion and neck supple.     Right lower leg: No tenderness. No edema.     Left lower leg: No tenderness. No edema.  Lymphadenopathy:     Cervical: No cervical adenopathy.  Skin:    General: Skin is warm and dry.     Findings: No erythema or rash.  Neurological:     Mental Status: He is alert.     Coordination: Coordination normal.  Psychiatric:        Behavior: Behavior normal.    ED Results / Procedures / Treatments   Labs (all labs ordered are listed, but only abnormal results are displayed) Labs Reviewed  BASIC METABOLIC PANEL - Abnormal;  Notable for the following components:      Result Value   CO2 21 (*)    Glucose, Bld 105 (*)    Calcium 8.6 (*)    All other components within normal limits  CBC  TROPONIN I (HIGH SENSITIVITY)    EKG EKG Interpretation  Date/Time:  Wednesday September 09 2021 07:36:18 EST Ventricular Rate:  62 PR Interval:  126 QRS Duration: 88 QT Interval:  410 QTC Calculation: 417 R Axis:   96 Text Interpretation: Sinus rhythm Borderline right axis deviation since 2016, no changes seen Confirmed by Eber HongMiller, Donnetta Gillin ([id number]) on 09/09/2021 7:48:53 AM  Radiology DG Chest Port 1 View  Result Date: 09/09/2021 CLINICAL DATA:  Left-sided chest pain since yesterday. EXAM: PORTABLE CHEST 1 VIEW COMPARISON:  07/10/2015 FINDINGS: The cardiac silhouette, mediastinal and hilar contours are normal. The lungs are clear. No pleural effusions. No pulmonary lesions. The bony thorax is intact. IMPRESSION: No acute cardiopulmonary findings. Electronically Signed   By: Rudie MeyerP.  Gallerani M.D.   On: 09/09/2021 08:11    Procedures Procedures    Medications Ordered in ED Medications  ibuprofen (ADVIL) tablet 400 mg (400 mg Oral Given 09/09/21 [id number])    ED Course/ Medical Decision Making/ A&P                           Medical Decision Making Amount and/or Complexity of Data Reviewed Labs: ordered. Radiology: ordered.  Risk Prescription drug management.   This patient presents to the ED for concern of chest pain, this involves an extensive number of treatment options, and is a complaint that carries with it a high risk of complications and morbidity.  The differential diagnosis includes chest wall syndrome, coronary syndrome, herpes zoster, pulmonary embolism though chest wall syndrome seems most likely given reproducible tenderness and atypical nature of the pain with a normal EKG   Co morbidities that complicate the patient evaluation  None   Additional history obtained:  Additional history obtained from  medical record External records from outside source obtained and reviewed including prior medical records and chest x-rays that have been obtained, last chest x-ray was from 2016 for the exact same, it was normal at that time   Lab Tests:  I Ordered, and personally interpreted labs.  The pertinent results include: Troponin, metabolic panel  and a CBC which showed no anemia, no electrolyte abnormalities, normal renal function and an unremarkable troponin   Imaging Studies ordered:  I ordered imaging studies including portable chest I independently visualized and interpreted imaging which showed no acute findings, no pneumonia, no pneumothorax, unremarkable mediastinum I agree with the radiologist interpretation   Cardiac Monitoring:  The patient was maintained on a cardiac monitor.  I personally viewed and interpreted the cardiac monitored which showed an underlying rhythm of: Normal sinus rhythm   Medicines ordered and prescription drug management:  I ordered medication including ibuprofen for chest wall pain Reevaluation of the patient after these medicines showed that the patient improved I have reviewed the patients home medicines and have made adjustments as needed   Test Considered:  CT scan, however very low likelihood of pulmonary embolism given mild bradycardia and no hypoxia or shortness of breath   Critical Interventions:  Evaluation for cause of chest pain   Problem List / ED Course:  Chest pain, likely chest wall pain, given NSAID, EKG troponin negative   Reevaluation:  After the interventions noted above, I reevaluated the patient and found that they have :improved   Social Determinants of Health:  None   Dispostion:  After consideration of the diagnostic results and the patients response to treatment, I feel that the patent would benefit from anticipate discharge home.          Final Clinical Impression(s) / ED Diagnoses Final diagnoses:   Left-sided chest pain    Rx / DC Orders ED Discharge Orders          Ordered    meloxicam (MOBIC) 7.5 MG tablet  2 times daily PRN        09/09/21 0836              Eber Hong, MD 09/09/21 [phone]

## 2021-11-26 ENCOUNTER — Encounter: Payer: Self-pay | Admitting: *Deleted

## 2022-01-20 ENCOUNTER — Encounter: Payer: Self-pay | Admitting: *Deleted

## 2022-01-28 ENCOUNTER — Ambulatory Visit: Payer: Commercial Managed Care - PPO

## 2022-07-20 ENCOUNTER — Encounter: Payer: Self-pay | Admitting: *Deleted

## 2023-03-21 IMAGING — DX DG CHEST 1V PORT
1 series · 1 of 1 positions shown · non-contrast
Comparison: 07/10/2015

CLINICAL DATA: Left-sided chest pain since yesterday.

EXAM:
PORTABLE CHEST 1 VIEW

[chest ap]
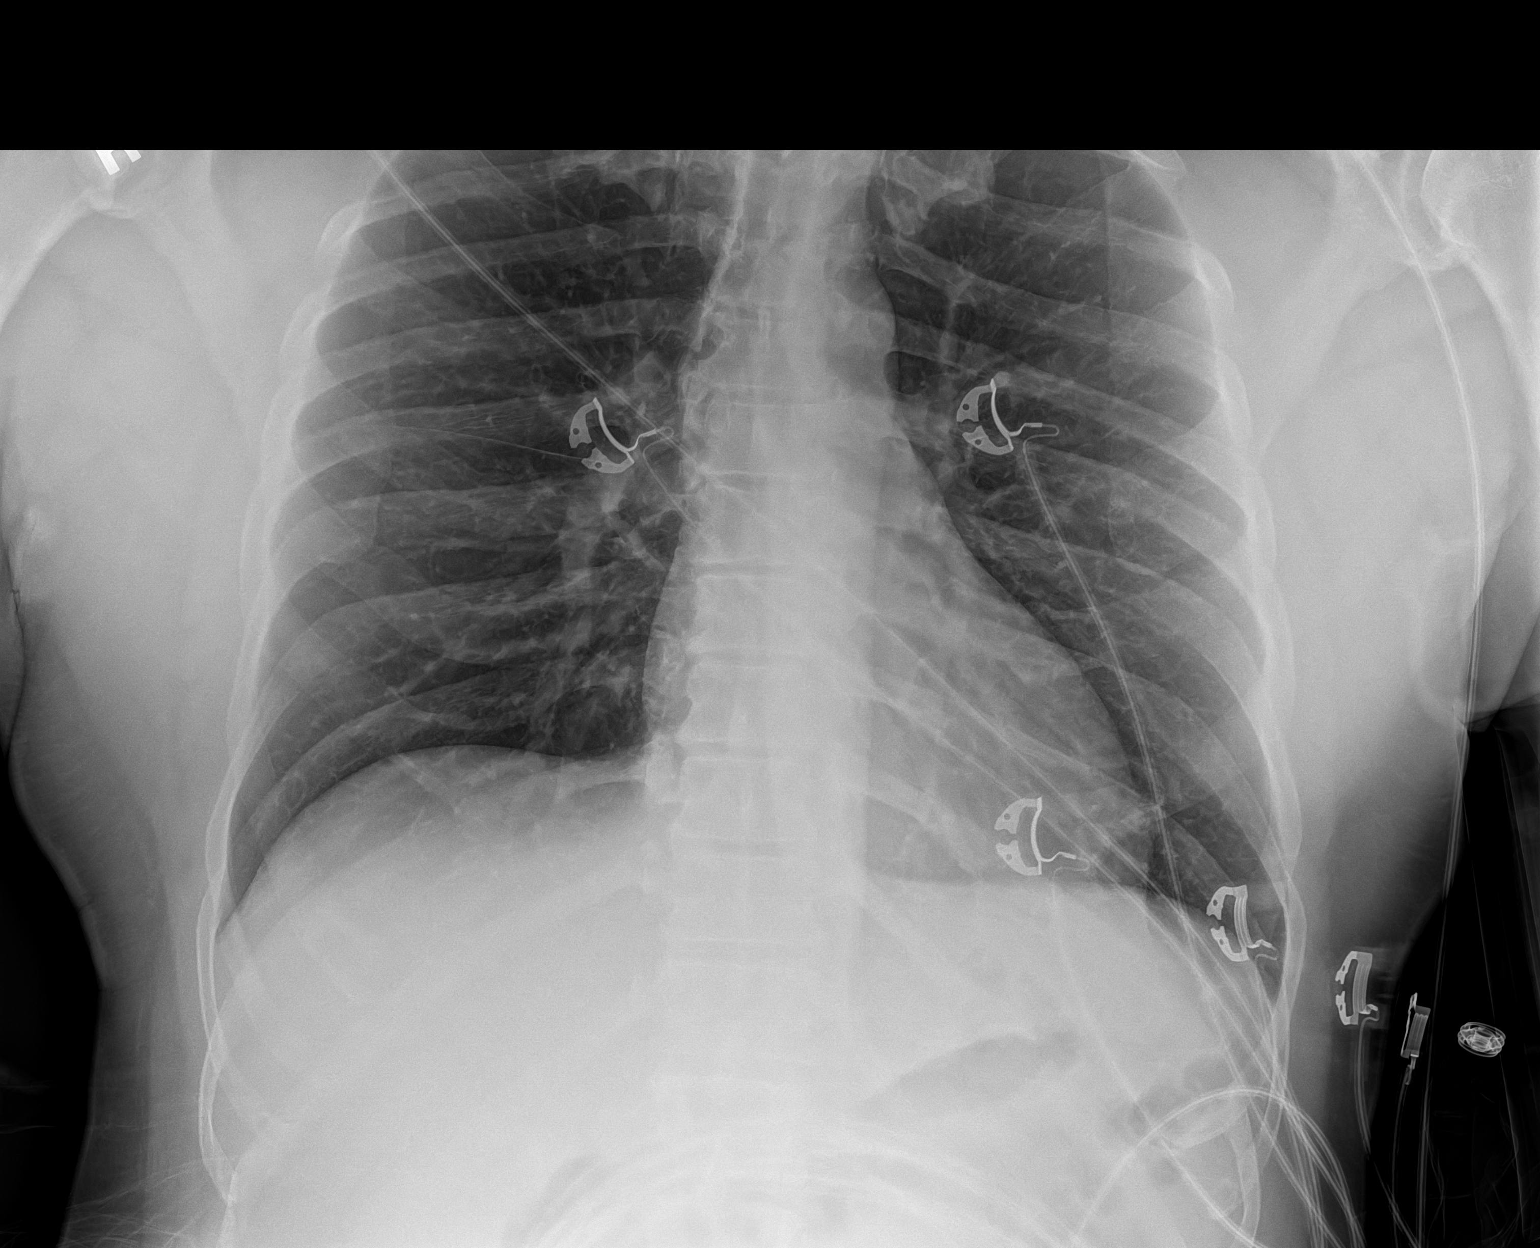

[1 of 1 positions shown; findings below may reference images not displayed]

FINDINGS: The cardiac silhouette, mediastinal and hilar contours are normal.
The lungs are clear. No pleural effusions. No pulmonary lesions. The
bony thorax is intact.
IMPRESSION: No acute cardiopulmonary findings.

## 2023-11-30 DIAGNOSIS — J069 Acute upper respiratory infection, unspecified: Secondary | ICD-10-CM | POA: Diagnosis not present

## 2023-11-30 DIAGNOSIS — R0689 Other abnormalities of breathing: Secondary | ICD-10-CM | POA: Diagnosis not present

## 2023-12-21 DIAGNOSIS — Z0000 Encounter for general adult medical examination without abnormal findings: Secondary | ICD-10-CM | POA: Diagnosis not present

## 2023-12-21 DIAGNOSIS — E6609 Other obesity due to excess calories: Secondary | ICD-10-CM | POA: Diagnosis not present

## 2023-12-21 DIAGNOSIS — E538 Deficiency of other specified B group vitamins: Secondary | ICD-10-CM | POA: Diagnosis not present

## 2023-12-21 DIAGNOSIS — Z6830 Body mass index (BMI) 30.0-30.9, adult: Secondary | ICD-10-CM | POA: Diagnosis not present

## 2023-12-21 DIAGNOSIS — E559 Vitamin D deficiency, unspecified: Secondary | ICD-10-CM | POA: Diagnosis not present

## 2023-12-21 DIAGNOSIS — Z0001 Encounter for general adult medical examination with abnormal findings: Secondary | ICD-10-CM | POA: Diagnosis not present

## 2023-12-21 DIAGNOSIS — I959 Hypotension, unspecified: Secondary | ICD-10-CM | POA: Diagnosis not present

## 2023-12-21 DIAGNOSIS — Z1331 Encounter for screening for depression: Secondary | ICD-10-CM | POA: Diagnosis not present

## 2023-12-21 DIAGNOSIS — Z125 Encounter for screening for malignant neoplasm of prostate: Secondary | ICD-10-CM | POA: Diagnosis not present
